# Patient Record
Sex: Female | Born: 1940 | Race: Black or African American | Hispanic: No | Smoking: Current every day smoker
Health system: Southern US, Community
[De-identification: ages and names within clinical notes are randomized; demographics above are authoritative.]

## PROBLEM LIST (undated history)

## (undated) ENCOUNTER — Emergency Department (HOSPITAL_BASED_OUTPATIENT_CLINIC_OR_DEPARTMENT_OTHER): Payer: Medicare Other | Source: Home / Self Care

## (undated) DIAGNOSIS — Z5189 Encounter for other specified aftercare: Secondary | ICD-10-CM

## (undated) DIAGNOSIS — H269 Unspecified cataract: Secondary | ICD-10-CM

## (undated) DIAGNOSIS — M1990 Unspecified osteoarthritis, unspecified site: Secondary | ICD-10-CM

## (undated) DIAGNOSIS — F419 Anxiety disorder, unspecified: Secondary | ICD-10-CM

## (undated) DIAGNOSIS — T7840XA Allergy, unspecified, initial encounter: Secondary | ICD-10-CM

## (undated) DIAGNOSIS — I10 Essential (primary) hypertension: Secondary | ICD-10-CM

## (undated) HISTORY — DX: Encounter for other specified aftercare: Z51.89

## (undated) HISTORY — DX: Unspecified cataract: H26.9

## (undated) HISTORY — PX: BACK SURGERY: SHX140

## (undated) HISTORY — DX: Allergy, unspecified, initial encounter: T78.40XA

## (undated) HISTORY — PX: ABDOMINAL HYSTERECTOMY: SHX81

---

## 2006-01-22 HISTORY — PX: COLONOSCOPY: SHX174

## 2006-10-31 ENCOUNTER — Ambulatory Visit: Payer: Self-pay | Admitting: Internal Medicine

## 2006-11-15 ENCOUNTER — Ambulatory Visit: Payer: Self-pay | Admitting: Internal Medicine

## 2008-12-21 ENCOUNTER — Encounter: Admission: RE | Admit: 2008-12-21 | Discharge: 2008-12-21 | Payer: Self-pay | Admitting: Family Medicine

## 2010-01-18 ENCOUNTER — Encounter
Admission: RE | Admit: 2010-01-18 | Discharge: 2010-01-18 | Payer: Self-pay | Source: Home / Self Care | Attending: Family Medicine | Admitting: Family Medicine

## 2011-03-30 ENCOUNTER — Encounter (HOSPITAL_BASED_OUTPATIENT_CLINIC_OR_DEPARTMENT_OTHER): Payer: Self-pay | Admitting: *Deleted

## 2011-03-30 ENCOUNTER — Emergency Department (HOSPITAL_BASED_OUTPATIENT_CLINIC_OR_DEPARTMENT_OTHER)
Admission: EM | Admit: 2011-03-30 | Discharge: 2011-03-30 | Disposition: A | Discharge status: Home / Self Care | Payer: Medicare Other | Attending: Emergency Medicine | Admitting: Emergency Medicine

## 2011-03-30 DIAGNOSIS — F411 Generalized anxiety disorder: Secondary | ICD-10-CM | POA: Insufficient documentation

## 2011-03-30 DIAGNOSIS — L0231 Cutaneous abscess of buttock: Secondary | ICD-10-CM | POA: Insufficient documentation

## 2011-03-30 DIAGNOSIS — IMO0001 Reserved for inherently not codable concepts without codable children: Secondary | ICD-10-CM | POA: Insufficient documentation

## 2011-03-30 DIAGNOSIS — I10 Essential (primary) hypertension: Secondary | ICD-10-CM | POA: Insufficient documentation

## 2011-03-30 DIAGNOSIS — M129 Arthropathy, unspecified: Secondary | ICD-10-CM | POA: Insufficient documentation

## 2011-03-30 DIAGNOSIS — Z79899 Other long term (current) drug therapy: Secondary | ICD-10-CM | POA: Insufficient documentation

## 2011-03-30 HISTORY — DX: Essential (primary) hypertension: I10

## 2011-03-30 HISTORY — DX: Unspecified osteoarthritis, unspecified site: M19.90

## 2011-03-30 HISTORY — DX: Anxiety disorder, unspecified: F41.9

## 2011-03-30 MED ORDER — OXYCODONE-ACETAMINOPHEN 5-325 MG PO TABS
1.0000 | ORAL_TABLET | Freq: Once | ORAL | Status: AC
  Administered 2011-03-30: 1 via ORAL
  Filled 2011-03-30: qty 1

## 2011-03-30 MED ORDER — OXYCODONE-ACETAMINOPHEN 5-325 MG PO TABS: 1.0000 | ORAL_TABLET | ORAL | Status: AC | PRN

## 2011-03-30 MED ORDER — SULFAMETHOXAZOLE-TRIMETHOPRIM 800-160 MG PO TABS: 1.0000 | ORAL_TABLET | Freq: Two times a day (BID) | ORAL | Status: AC

## 2011-03-30 MED ORDER — LIDOCAINE HCL 2 % IJ SOLN
10.0000 mL | Freq: Once | INTRAMUSCULAR | Status: AC
  Administered 2011-03-30: 200 mg
  Filled 2011-03-30: qty 1

## 2011-03-30 MED ORDER — SULFAMETHOXAZOLE-TMP DS 800-160 MG PO TABS
1.0000 | ORAL_TABLET | Freq: Once | ORAL | Status: AC
  Administered 2011-03-30: 1 via ORAL
  Filled 2011-03-30: qty 1

## 2011-03-30 NOTE — Discharge Instructions (Signed)
Return in 2 days to have the packing removed.  Abscess An abscess (boil or furuncle) is an infected area that contains a collection of pus.  SYMPTOMS Signs and symptoms of an abscess include pain, tenderness, redness, or hardness. You may feel a moveable soft area under your skin. An abscess can occur anywhere in the body.  TREATMENT  A surgical cut (incision) may be made over your abscess to drain the pus. Gauze may be packed into the space or a drain may be looped through the abscess cavity (pocket). This provides a drain that will allow the cavity to heal from the inside outwards. The abscess may be painful for a few days, but should feel much better if it was drained.  Your abscess, if seen early, may not have localized and may not have been drained. If not, another appointment may be required if it does not get better on its own or with medications. HOME CARE INSTRUCTIONS   Only take over-the-counter or prescription medicines for pain, discomfort, or fever as directed by your caregiver.   Take your antibiotics as directed if they were prescribed. Finish them even if you start to feel better.   Keep the skin and clothes clean around your abscess.   If the abscess was drained, you will need to use gauze dressing to collect any draining pus. Dressings will typically need to be changed 3 or more times a day.   The infection may spread by skin contact with others. Avoid skin contact as much as possible.   Practice good hygiene. This includes regular hand washing, cover any draining skin lesions, and do not share personal care items.   If you participate in sports, do not share athletic equipment, towels, whirlpools, or personal care items. Shower after every practice or tournament.   If a draining area cannot be adequately covered:   Do not participate in sports.   Children should not participate in day care until the wound has healed or drainage stops.   If your caregiver has given  you a follow-up appointment, it is very important to keep that appointment. Not keeping the appointment could result in a much worse infection, chronic or permanent injury, pain, and disability. If there is any problem keeping the appointment, you must call back to this facility for assistance.  SEEK MEDICAL CARE IF:   You develop increased pain, swelling, redness, drainage, or bleeding in the wound site.   You develop signs of generalized infection including muscle aches, chills, fever, or a general ill feeling.   You have an oral temperature above 102 F (38.9 C).  MAKE SURE YOU:   Understand these instructions.   Will watch your condition.   Will get help right away if you are not doing well or get worse.  Document Released: 10/18/2004 Document Revised: 12/28/2010 Document Reviewed: 08/12/2007 [facility] Patient Information 2012 [place], [place].  Sulfamethoxazole; Trimethoprim, SMX-TMP tablets What is this medicine? SULFAMETHOXAZOLE; TRIMETHOPRIM or SMX-TMP (suhl fuh meth OK suh zohl; trye METH oh prim) is a combination of a sulfonamide antibiotic and a second antibiotic, trimethoprim. It is used to treat or prevent certain kinds of bacterial infections. It will not work for colds, flu, or other viral infections. This medicine may be used for other purposes; ask your health care provider or pharmacist if you have questions. What should I tell my health care provider before I take this medicine? They need to know if you have any of these conditions: -anemia -asthma -being treated  with anticonvulsants -if you frequently drink alcohol containing drinks -kidney disease -liver disease -low level of folic acid or [id number]-4-[id number] dehydrogenase -poor nutrition or malabsorption -porphyria -severe allergies -thyroid disorder -an unusual or allergic reaction to sulfamethoxazole, trimethoprim, sulfa drugs, other medicines, foods, dyes, or preservatives -pregnant or trying to get  pregnant -breast-feeding How should I use this medicine? Take this medicine by mouth with a full glass of water. Follow the directions on the prescription label. Take your medicine at regular intervals. Do not take it more often than directed. Do not skip doses or stop your medicine early. Talk to your pediatrician regarding the use of this medicine in children. Special care may be needed. This medicine has been used in children as young as 58 months of age. Overdosage: If you think you have taken too much of this medicine contact a poison control center or emergency room at once. NOTE: This medicine is only for you. Do not share this medicine with others. What if I miss a dose? If you miss a dose, take it as soon as you can. If it is almost time for your next dose, take only that dose. Do not take double or extra doses. What may interact with this medicine? Do not take this medicine with any of the following medications: -aminobenzoate potassium -dofetilide -metronidazole This medicine may also interact with the following medications: -ACE inhibitors like benazepril, enalapril, lisinopril, and ramipril -cyclosporine -digoxin -diuretics -indomethacin -medicines for diabetes -methenamine -methotrexate -phenytoin -potassium supplements -pyrimethamine -sulfinpyrazone -tricyclic antidepressants -warfarin This list may not describe all possible interactions. Give your health care provider a list of all the medicines, herbs, non-prescription drugs, or dietary supplements you use. Also tell them if you smoke, drink alcohol, or use illegal drugs. Some items may interact with your medicine. What should I watch for while using this medicine? Tell your doctor or health care professional if your symptoms do not improve. Drink several glasses of water a day to reduce the risk of kidney problems. Do not treat diarrhea with over the counter products. Contact your doctor if you have diarrhea that  lasts more than 2 days or if it is severe and watery. This medicine can make you more sensitive to the sun. Keep out of the sun. If you cannot avoid being in the sun, wear protective clothing and use a sunscreen. Do not use sun lamps or tanning beds/booths. What side effects may I notice from receiving this medicine? Side effects that you should report to your doctor or health care professional as soon as possible: -allergic reactions like skin rash or hives, swelling of the face, lips, or tongue -breathing problems -fever or chills, sore throat -irregular heartbeat, chest pain -joint or muscle pain -pain or difficulty passing urine -red pinpoint spots on skin -redness, blistering, peeling or loosening of the skin, including inside the mouth -unusual bleeding or bruising -unusually weak or tired -yellowing of the eyes or skin Side effects that usually do not require medical attention (report to your doctor or health care professional if they continue or are bothersome): -diarrhea -dizziness -headache -loss of appetite -nausea, vomiting -nervousness This list may not describe all possible side effects. Call your doctor for medical advice about side effects. You may report side effects to FDA at 1-800-FDA-1088. Where should I keep my medicine? Keep out of the reach of children. Store at room temperature between 20 to 25 degrees C (68 to 77 degrees F). Protect from light. Throw away any unused medicine after the  expiration date. NOTE: This sheet is a summary. It may not cover all possible information. If you have questions about this medicine, talk to your doctor, pharmacist, or health care provider.  2012, [employer] Standard. (09/10/2007 11:32:51 AM)  Acetaminophen; Oxycodone tablets What is this medicine? ACETAMINOPHEN; OXYCODONE (a set a MEE noe fen; ox i KOE done) is a pain reliever. It is used to treat mild to moderate pain. This medicine may be used for other purposes; ask your  health care provider or pharmacist if you have questions. What should I tell my health care provider before I take this medicine? They need to know if you have any of these conditions: -brain tumor -Crohn's disease, inflammatory bowel disease, or ulcerative colitis -drink more than 3 alcohol containing drinks per day -drug abuse or addiction -head injury -heart or circulation problems -kidney disease or problems going to the bathroom -liver disease -lung disease, asthma, or breathing problems -an unusual or allergic reaction to acetaminophen, oxycodone, other opioid analgesics, other medicines, foods, dyes, or preservatives -pregnant or trying to get pregnant -breast-feeding How should I use this medicine? Take this medicine by mouth with a full glass of water. Follow the directions on the prescription label. Take your medicine at regular intervals. Do not take your medicine more often than directed. Talk to your pediatrician regarding the use of this medicine in children. Special care may be needed. Patients over 93 years old may have a stronger reaction and need a smaller dose. Overdosage: If you think you have taken too much of this medicine contact a poison control center or emergency room at once. NOTE: This medicine is only for you. Do not share this medicine with others. What if I miss a dose? If you miss a dose, take it as soon as you can. If it is almost time for your next dose, take only that dose. Do not take double or extra doses. What may interact with this medicine? -alcohol or medicines that contain alcohol -antihistamines -barbiturates like amobarbital, butalbital, butabarbital, methohexital, pentobarbital, phenobarbital, thiopental, and secobarbital -benztropine -drugs for bladder problems like solifenacin, trospium, oxybutynin, tolterodine, hyoscyamine, and methscopolamine -drugs for breathing problems like ipratropium and tiotropium -drugs for certain stomach or  intestine problems like propantheline, homatropine methylbromide, glycopyrrolate, atropine, belladonna, and dicyclomine -general anesthetics like etomidate, ketamine, nitrous oxide, propofol, desflurane, enflurane, halothane, isoflurane, and sevoflurane -medicines for depression, anxiety, or psychotic disturbances -medicines for pain like codeine, morphine, pentazocine, buprenorphine, butorphanol, nalbuphine, tramadol, and propoxyphene -medicines for sleep -muscle relaxants -naltrexone -phenothiazines like perphenazine, thioridazine, chlorpromazine, mesoridazine, fluphenazine, prochlorperazine, promazine, and trifluoperazine -scopolamine -trihexyphenidyl This list may not describe all possible interactions. Give your health care provider a list of all the medicines, herbs, non-prescription drugs, or dietary supplements you use. Also tell them if you smoke, drink alcohol, or use illegal drugs. Some items may interact with your medicine. What should I watch for while using this medicine? Tell your doctor or health care professional if your pain does not go away, if it gets worse, or if you have new or a different type of pain. You may develop tolerance to the medicine. Tolerance means that you will need a higher dose of the medication for pain relief. Tolerance is normal and is expected if you take this medicine for a long time. Do not suddenly stop taking your medicine because you may develop a severe reaction. Your body becomes used to the medicine. This does NOT mean you are addicted. Addiction is a behavior related to getting  and using a drug for a nonmedical reason. If you have pain, you have a medical reason to take pain medicine. Your doctor will tell you how much medicine to take. If your doctor wants you to stop the medicine, the dose will be slowly lowered over time to avoid any side effects. You may get drowsy or dizzy. Do not drive, use machinery, or do anything that needs mental alertness  until you know how this medicine affects you. Do not stand or sit up quickly, especially if you are an older patient. This reduces the risk of dizzy or fainting spells. Alcohol may interfere with the effect of this medicine. Avoid alcoholic drinks. The medicine will cause constipation. Try to have a bowel movement at least every 2 to 3 days. If you do not have a bowel movement for 3 days, call your doctor or health care professional. Do not take Tylenol (acetaminophen) or medicines that have acetaminophen with this medicine. Too much acetaminophen can be very dangerous. Many nonprescription medicines contain acetaminophen. Always read the labels carefully to avoid taking more acetaminophen. What side effects may I notice from receiving this medicine? Side effects that you should report to your doctor or health care professional as soon as possible: -allergic reactions like skin rash, itching or hives, swelling of the face, lips, or tongue -breathing difficulties, wheezing -confusion -light headedness or fainting spells -severe stomach pain -yellowing of the skin or the whites of the eyes Side effects that usually do not require medical attention (report to your doctor or health care professional if they continue or are bothersome): -dizziness -drowsiness -nausea -vomiting This list may not describe all possible side effects. Call your doctor for medical advice about side effects. You may report side effects to FDA at 1-800-FDA-1088. Where should I keep my medicine? Keep out of the reach of children. This medicine can be abused. Keep your medicine in a safe place to protect it from theft. Do not share this medicine with anyone. Selling or giving away this medicine is dangerous and against the law. Store at room temperature between 20 and 25 degrees C (68 and 77 degrees F). Keep container tightly closed. Protect from light. Flush any unused medicines down the toilet. Do not use the medicine after  the expiration date. NOTE: This sheet is a summary. It may not cover all possible information. If you have questions about this medicine, talk to your doctor, pharmacist, or health care provider.  2012, [employer]vier/Gold Standard. (12/08/2007 10:01:21 AM)

## 2011-03-30 NOTE — ED Notes (Signed)
Pt was asked to undress completely so that EDP could examine the affected area.

## 2011-03-30 NOTE — ED Provider Notes (Signed)
History     CSN: [id number]  Arrival date & time 03/30/11  2024   First MD Initiated Contact with Patient 03/30/11 2153      Chief Complaint  Patient presents with  . Abscess    (Consider location/radiation/quality/duration/timing/severity/associated sxs/prior treatment) Patient is a 71 y.o. female presenting with abscess. The history is provided by the patient.  Abscess   She noted the onset 3 days ago of an abscess on the left buttock near the perineal area. She's tried warm soaks but it has gotten worse. It is painful to sit on it but nothing makes it better. She rates the pain as moderate to severe and rated at 8/10 currently. She has not taken any medication for pain. She denies fever, chills, sweats. She says the last abscess she had was 40 years ago.  Past Medical History  Diagnosis Date  . Arthritis   . Hypertension   . Anxiety     Past Surgical History  Procedure Date  . Abdominal hysterectomy   . Back surgery     History reviewed. No pertinent family history.  History  Substance Use Topics  . Smoking status: Never Smoker   . Smokeless tobacco: Not on file  . Alcohol Use: No    OB History    Grav Para Term Preterm Abortions TAB SAB Ect Mult Living                  Review of Systems  All other systems reviewed and are negative.    Allergies  Review of patient's allergies indicates no known allergies.  Home Medications   Current Outpatient Rx  Name Route Sig Dispense Refill  . ACETAMINOPHEN 325 MG PO TABS Oral Take 650 mg by mouth every 6 (six) hours as needed. Patient used this medication for her arthritis pain.    Marland Kitchen CITALOPRAM HYDROBROMIDE 20 MG PO TABS Oral Take 20 mg by mouth daily.    Marland Kitchen OMEPRAZOLE 20 MG PO CPDR Oral Take 20 mg by mouth daily.    Marland Kitchen KLOR-CON M20 PO Oral Take 1 tablet by mouth daily.    . TRIAMTERENE-HCTZ 37.5-25 MG PO CAPS Oral Take 1 capsule by mouth every morning.      BP 130/60  Pulse 90  Temp(Src) 98.5 F (36.9 C)  (Oral)  Resp 18  SpO2 99%  Physical Exam  Nursing note and vitals reviewed.  71 year old female who is resting comfortably and in no acute distress. Vital signs are normal. Oxygen saturation is 99% which is normal. Head is normocephalic and atraumatic. PERRLA, EOMI. Oropharynx is clear. Neck is nontender and supple. Back is nontender. Lungs are clear without rales, wheezes, rhonchi. Heart has regular rate rhythm without murmur. Abdomen is soft, flat, nontender without masses or hepatosplenomegaly. Examination of the rectal area shows an abscess on the inferior aspect of the left buttock near the midline and near the perineum. There is no extension to the perirectal area and no extension into the labia. There is mild erythema of the overlying skin. Extremities have no cyanosis or edema, full range of motion is present. Skin is warm and dry without other rash. Neurologic: Mental status is normal, cranial nerves are intact, no focal motor or sensory deficits.  ED Course  INCISION AND DRAINAGE Date/Time: 03/30/2011 3:00 PM Performed by: Dione Booze Authorized by: Preston Fleeting, Britton Bera Consent: Verbal consent obtained. Written consent not obtained. Risks and benefits: risks, benefits and alternatives were discussed Consent given by: patient Patient understanding: patient states understanding  of the procedure being performed Patient consent: the patient's understanding of the procedure matches consent given Procedure consent: procedure consent matches procedure scheduled Relevant documents: relevant documents present and verified Site marked: the operative site was marked Required items: required blood products, implants, devices, and special equipment available Patient identity confirmed: verbally with patient and arm band Time out: Immediately prior to procedure a "time out" was called to verify the correct patient, procedure, equipment, support staff and site/side marked as required. Type: abscess Body  area: anogenital Location details: gluteal cleft Anesthesia: local infiltration Local anesthetic: lidocaine 2% without epinephrine Patient sedated: no Scalpel size: 11 Incision type: single straight Complexity: simple Drainage: purulent Drainage amount: moderate Packing material: 1/4 in iodoform gauze Patient tolerance: Patient tolerated the procedure well with no immediate complications.   (including critical care time)    1. Abscess, gluteal, left    She was given a dose of Bactrim and Percocet in the emergency department. She got good relief of pain with the Percocet. She'll be sent home with prescriptions for both of those.   MDM  Abscess of left buttock. This will need incision and drainage.        Dione Booze, MD 03/30/11 [phone]

## 2011-03-30 NOTE — ED Notes (Signed)
Abscess on her right buttocks x 4 days. No improvement with warm soaks.

## 2011-03-30 NOTE — ED Notes (Signed)
Physician at bedside to complete I&D procedure

## 2012-08-24 IMAGING — US US ABDOMEN COMPLETE
1 series · 14 of 25 positions shown · non-contrast
Comparison: None.

CLINICAL DATA: Elevated LFTs, hypertension next field none

COMPLETE ABDOMINAL ULTRASOUND

[Series 1: us abdomen complete · 0.28mm/px · 14 of 83 slices shown]
[im 1/83]
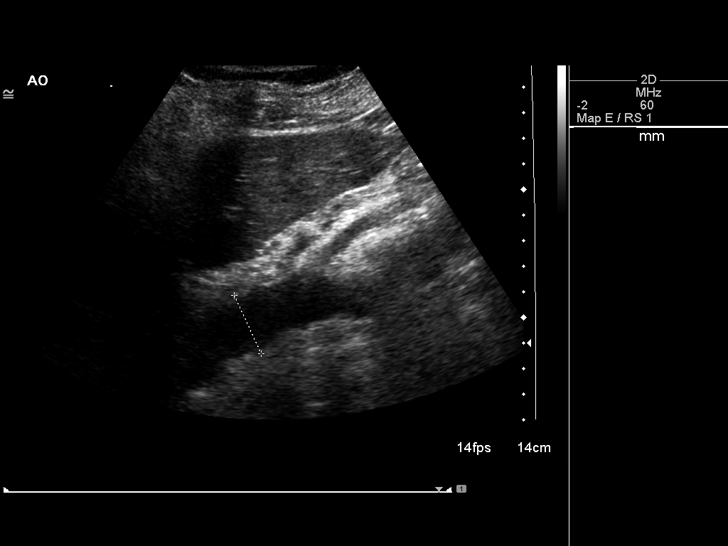
[im 7/83]
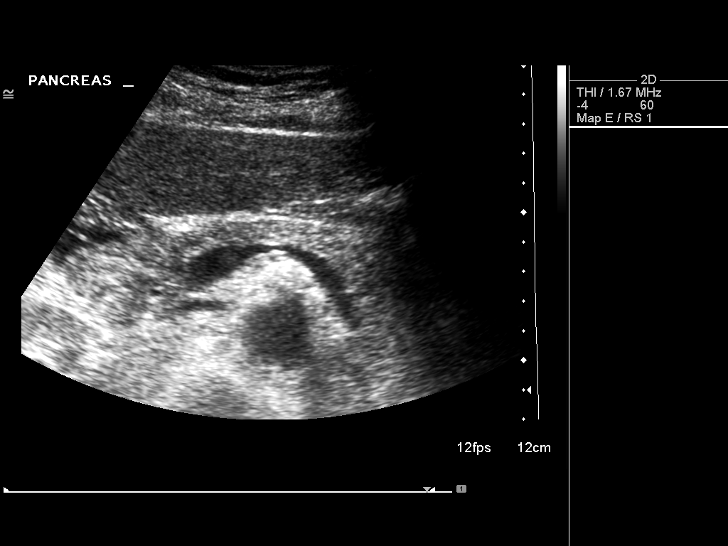
[im 14/83]
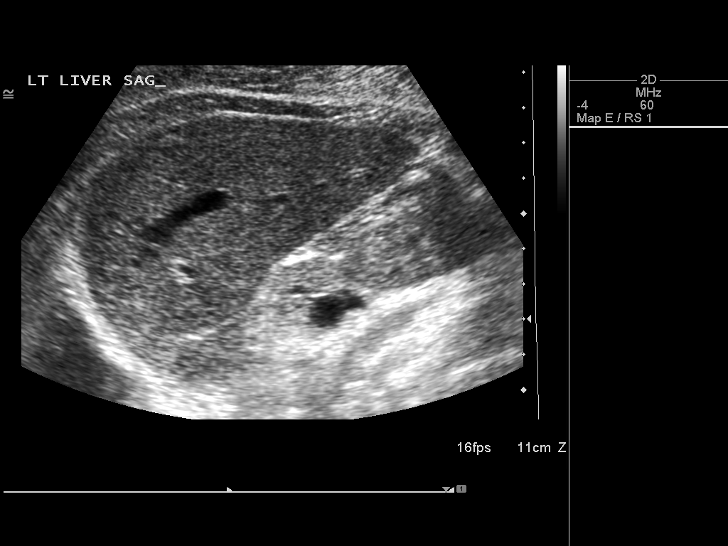
[im 21/83]
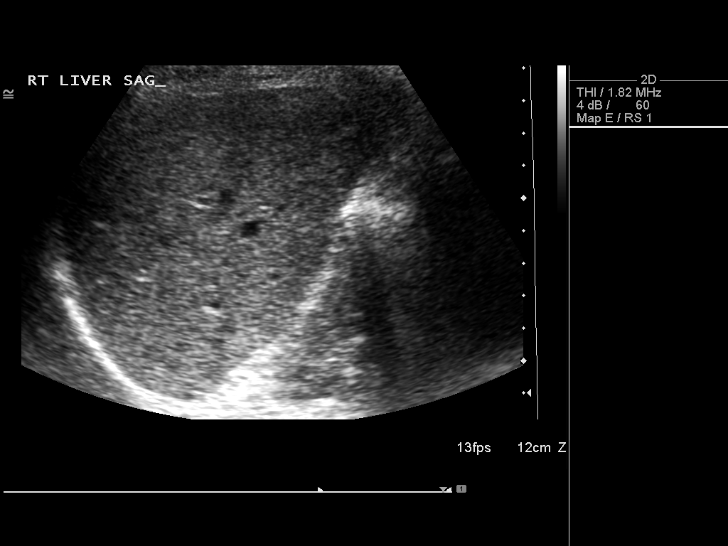
[im 28/83]
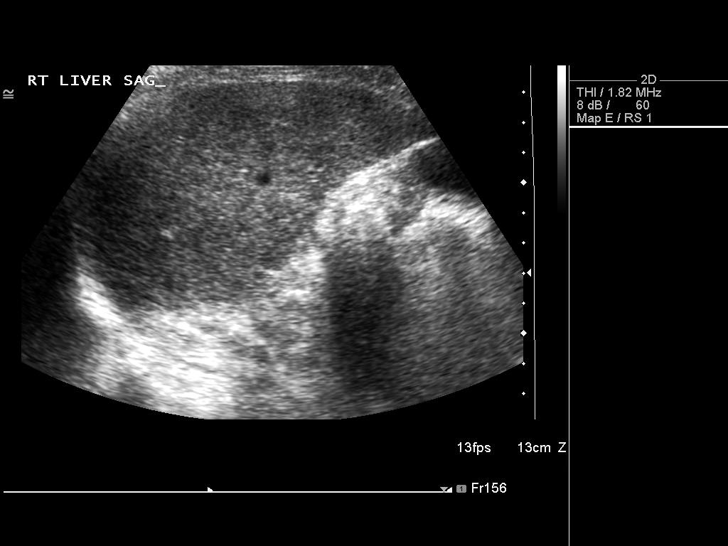
[im 31/83]
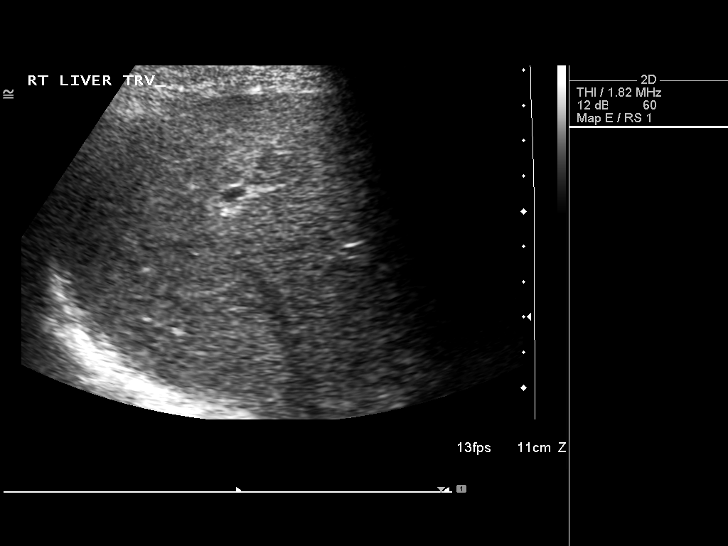
[im 38/83]
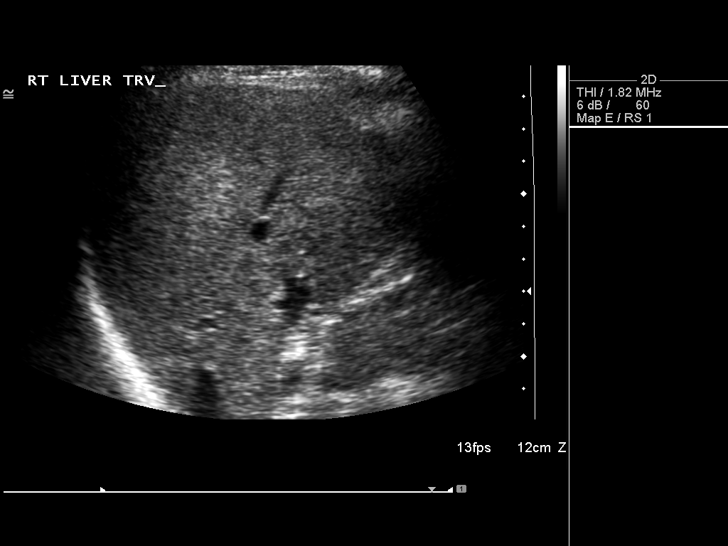
[im 45/83]
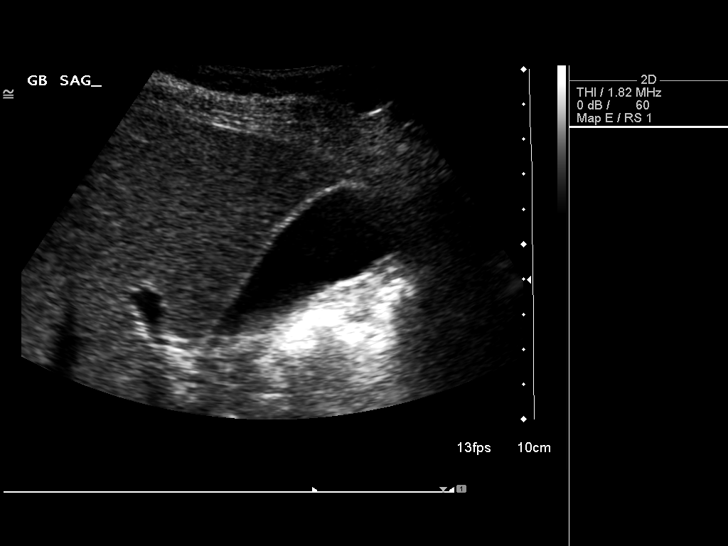
[im 52/83]
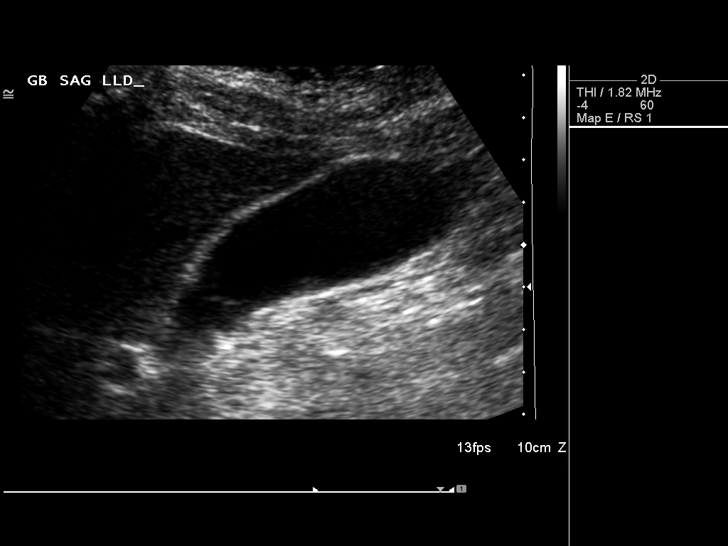
[im 55/83]
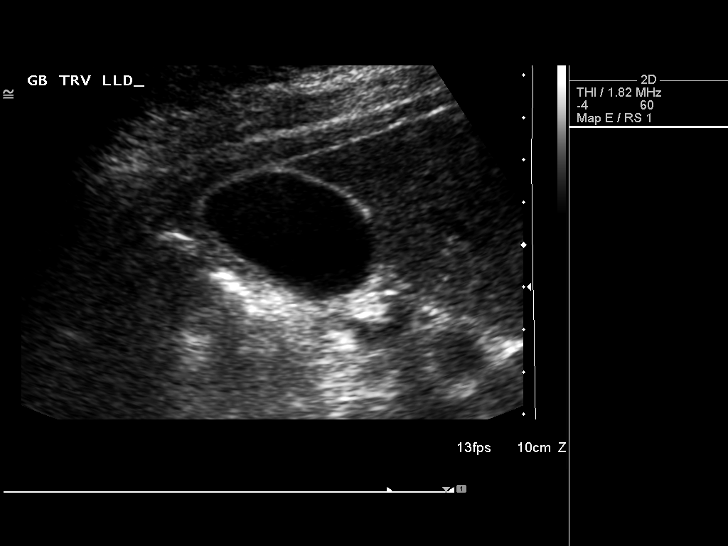
[im 62/83]
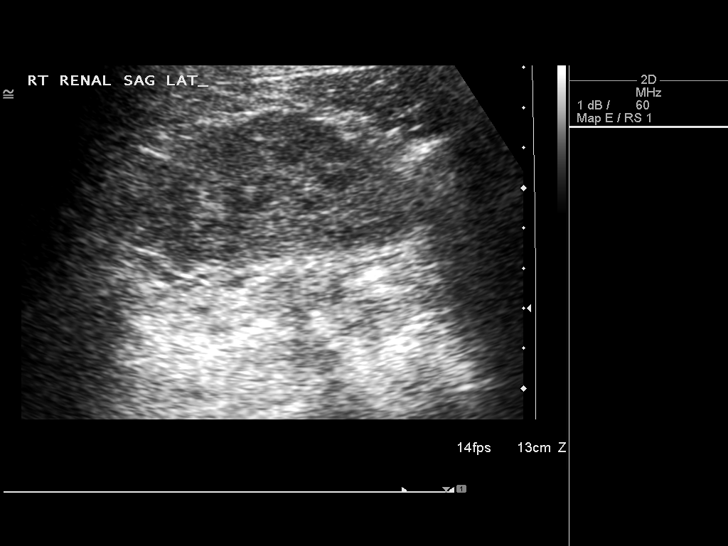
[im 69/83]
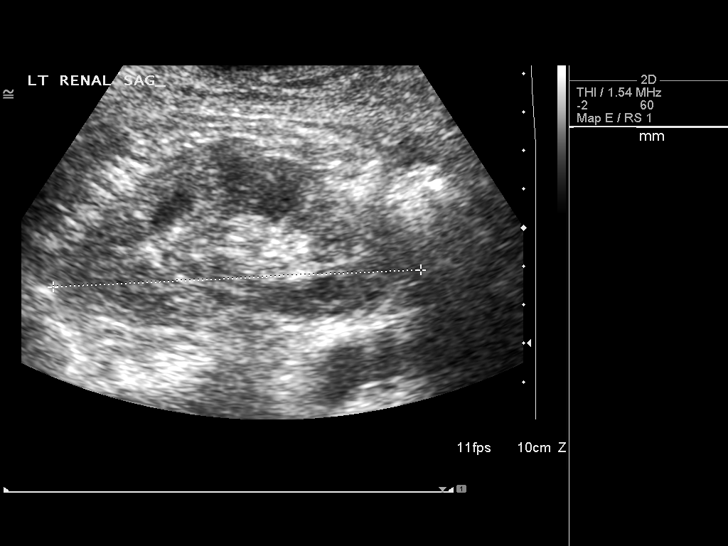
[im 76/83]
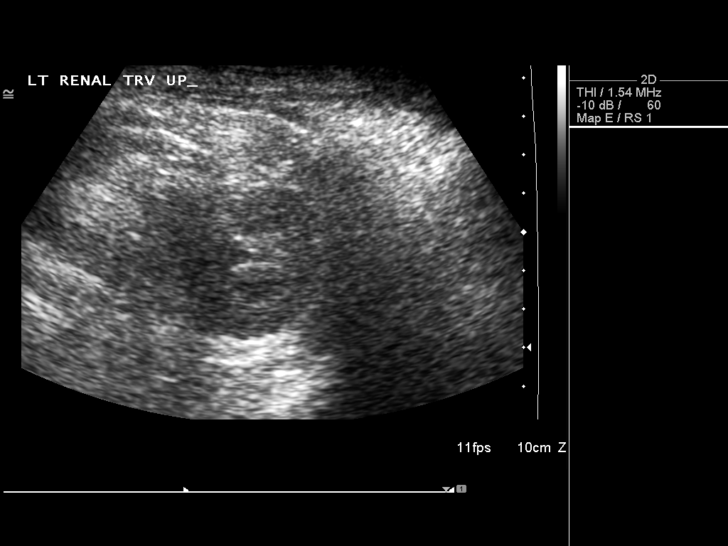
[im 83/83]
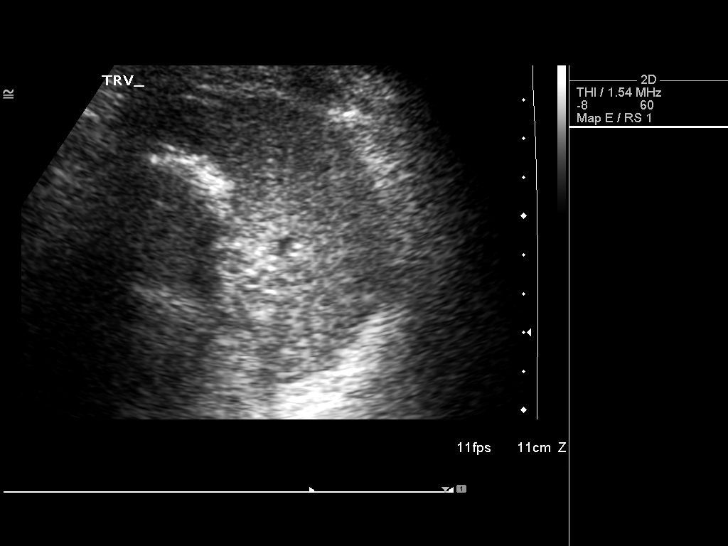

[14 of 25 positions shown; findings below may reference images not displayed]

FINDINGS: Gallbladder:  No gallstones, gallbladder wall thickening, or
pericholecystic fluid.

Common bile duct:  3.3 mm diameter, unremarkable

Liver:  No focal lesion identified.  Within normal limits in
parenchymal echogenicity.

IVC:  Appears normal.

Pancreas:  No focal abnormality seen.

Spleen:  6.3 cm craniocaudal length, unremarkable.

Right Kidney:  9.9 cm. No hydronephrosis.  Well-preserved cortex.
Normal size and parenchymal echotexture without focal
abnormalities.

Left Kidney:  9.5 cm. No hydronephrosis.  Well-preserved cortex.
Normal size and parenchymal echotexture without focal
abnormalities.

Abdominal aorta:  No aneurysm identified.
IMPRESSION: 1.  Negative.  Unremarkable liver.

## 2016-12-26 ENCOUNTER — Encounter: Payer: Self-pay | Admitting: Internal Medicine

## 2017-06-07 ENCOUNTER — Encounter: Payer: Self-pay | Admitting: Internal Medicine

## 2017-07-13 ENCOUNTER — Other Ambulatory Visit: Payer: Self-pay

## 2017-07-13 ENCOUNTER — Emergency Department (HOSPITAL_BASED_OUTPATIENT_CLINIC_OR_DEPARTMENT_OTHER)
Admission: EM | Admit: 2017-07-13 | Discharge: 2017-07-13 | Disposition: A | Discharge status: Home / Self Care | Payer: Medicare Other | Attending: Emergency Medicine | Admitting: Emergency Medicine

## 2017-07-13 ENCOUNTER — Encounter (HOSPITAL_BASED_OUTPATIENT_CLINIC_OR_DEPARTMENT_OTHER): Payer: Self-pay | Admitting: Emergency Medicine

## 2017-07-13 DIAGNOSIS — K029 Dental caries, unspecified: Secondary | ICD-10-CM | POA: Diagnosis not present

## 2017-07-13 DIAGNOSIS — K047 Periapical abscess without sinus: Secondary | ICD-10-CM

## 2017-07-13 DIAGNOSIS — K1379 Other lesions of oral mucosa: Secondary | ICD-10-CM | POA: Diagnosis not present

## 2017-07-13 DIAGNOSIS — F419 Anxiety disorder, unspecified: Secondary | ICD-10-CM | POA: Insufficient documentation

## 2017-07-13 DIAGNOSIS — I10 Essential (primary) hypertension: Secondary | ICD-10-CM | POA: Insufficient documentation

## 2017-07-13 DIAGNOSIS — Z79899 Other long term (current) drug therapy: Secondary | ICD-10-CM | POA: Insufficient documentation

## 2017-07-13 DIAGNOSIS — K120 Recurrent oral aphthae: Secondary | ICD-10-CM

## 2017-07-13 DIAGNOSIS — R6884 Jaw pain: Secondary | ICD-10-CM | POA: Diagnosis present

## 2017-07-13 MED ORDER — AMOXICILLIN-POT CLAVULANATE 875-125 MG PO TABS
1.0000 | ORAL_TABLET | Freq: Once | ORAL | Status: AC
  Administered 2017-07-13: 1 via ORAL
  Filled 2017-07-13: qty 1

## 2017-07-13 MED ORDER — HYDROCODONE-ACETAMINOPHEN 5-325 MG PO TABS: 1.0000 | ORAL_TABLET | Freq: Four times a day (QID) | ORAL | 0 refills | Status: AC | PRN

## 2017-07-13 MED ORDER — AMOXICILLIN-POT CLAVULANATE 875-125 MG PO TABS: 1.0000 | ORAL_TABLET | Freq: Two times a day (BID) | ORAL | 0 refills | Status: DC

## 2017-07-13 MED ORDER — HYDROCODONE-ACETAMINOPHEN 5-325 MG PO TABS
1.0000 | ORAL_TABLET | Freq: Once | ORAL | Status: AC
  Administered 2017-07-13: 1 via ORAL
  Filled 2017-07-13: qty 1

## 2017-07-13 NOTE — ED Triage Notes (Signed)
Patient states that she has pain to the left lower jaw. The patient reports that she may have an abscess as well.

## 2017-07-13 NOTE — ED Provider Notes (Signed)
[facility] EMERGENCY DEPARTMENT Provider Note   CSN: [id number] Arrival date & time: 07/13/17  1805     History   Chief Complaint Chief Complaint  Patient presents with  . Dental Pain    HPI Kathy Rivera is a 77 y.o. female hx of HTN, anxiety, poor dentition here with L lower jaw pain. States that she had L lower jaw and dental pain for the last 2-3 days. She noticed swelling of her gums. Denies trouble swallowing or fever or trouble breathing. She states that she had multiple crowns placed years ago and was told that she needed more dental work done. Denies hx of diabetes.   The history is provided by the patient.    Past Medical History:  Diagnosis Date  . Anxiety   . Arthritis   . Hypertension     There are no active problems to display for this patient.   Past Surgical History:  Procedure Laterality Date  . ABDOMINAL HYSTERECTOMY    . BACK SURGERY       OB History   None      Home Medications    Prior to Admission medications   Medication Sig Start Date End Date Taking? Authorizing Provider  acetaminophen (TYLENOL) 325 MG tablet Take 650 mg by mouth every 6 (six) hours as needed. Patient used this medication for her arthritis pain.    [provider]  citalopram (CELEXA) 20 MG tablet Take 20 mg by mouth daily.    [provider]  omeprazole (PRILOSEC) 20 MG capsule Take 20 mg by mouth daily.    [provider]  Potassium Chloride Crys CR (KLOR-CON M20 PO) Take 1 tablet by mouth daily.    [provider]  triamterene-hydrochlorothiazide (DYAZIDE) 37.5-25 MG per capsule Take 1 capsule by mouth every morning.    [provider]    Family History History reviewed. No pertinent family history.  Social History Social History   Tobacco Use  . Smoking status: Never Smoker  Substance Use Topics  . Alcohol use: No  . Drug use: No     Allergies   Codeine   Review of Systems Review of Systems    HENT: Positive for dental problem.   All other systems reviewed and are negative.    Physical Exam Updated Vital Signs BP (!) 141/85 (BP Location: Left Arm)   Pulse 75   Temp 98.6 F (37 C) (Oral)   Resp 16   Ht 5\' 6"  (1.676 m)   Wt 78 kg (172 lb)   SpO2 98%   BMI 27.76 kg/m   Physical Exam  Constitutional: She appears well-developed.  HENT:  Head: Normocephalic.  Mouth/Throat:    Filling L lower premolar and canine. Some gum swelling but no obvious abscess. There is aphthous ulcer next to L lower premolar. No cervical LAD, floor of mouth soft, uvula midline, tonsils not enlarged   Eyes: Pupils are equal, round, and reactive to light. EOM are normal.  Neck: Normal range of motion. Neck supple.  Cardiovascular: Normal rate and regular rhythm.  Pulmonary/Chest: Effort normal and breath sounds normal.  Abdominal: Soft. Bowel sounds are normal. She exhibits no distension.  Musculoskeletal: Normal range of motion.  Neurological: She is alert.  Skin: Skin is warm.  Psychiatric: She has a normal mood and affect.  Nursing note and vitals reviewed.    ED Treatments / Results  Labs (all labs ordered are listed, but only abnormal results are displayed) Labs Reviewed -  No data to display  EKG None  Radiology No results found.  Procedures Procedures (including critical care time)  Medications Ordered in ED Medications  amoxicillin-clavulanate (AUGMENTIN) 875-125 MG per tablet 1 tablet (has no administration in time range)  HYDROcodone-acetaminophen (NORCO/VICODIN) 5-325 MG per tablet 1 tablet (has no administration in time range)     Initial Impression / Assessment and Plan / ED Course  I have reviewed the triage vital signs and the nursing notes.  Pertinent labs & imaging results that were available during my care of the patient were reviewed by me and considered in my medical decision making (see chart for details).     Chauncey FischerCarrie L Stryker is a 77 y.o. female here  with l lower gum swelling and pain. There is some filling, ? Cavities there still. Likely gum infection vs early periapical abscess. However, there doesn't seem to be large enough for I and D. She also has aphthous ulcer that is likely causing her pain. Will give augmentin, vicodin for pain. She has dental appointment on Monday.    Final Clinical Impressions(s) / ED Diagnoses   Final diagnoses:  None    ED Discharge Orders    None       Charlynne PanderYao, Clavin Ruhlman Hsienta, MD 07/13/17 Nicholos [id number]

## 2017-07-13 NOTE — ED Notes (Signed)
Pt teaching provided on medications that may cause drowsiness. Pt instructed not to drive or operate heavy machinery while taking the prescribed medication. Pt verbalized understanding.   

## 2017-07-13 NOTE — Discharge Instructions (Signed)
Take motrin for pain.   Take vicodin for severe pain. Do NOT drive with it   Take augmentin twice daily for a week.   See your dentist as scheduled on Monday   Return to ER if you have worse neck or gum swelling, severe dental pain, fever, trouble swallowing, trouble breathing

## 2017-07-17 ENCOUNTER — Telehealth: Payer: Self-pay

## 2017-07-17 NOTE — Telephone Encounter (Signed)
Patient No Showed for Pre-Visit today. A message was left on voice mail to call and reschedule before 5:00 Pm today. If patient does not reschedule today her colonoscopy will be cancelled per [facility] guidelines and a No Show letter will be mailed.   Janalee DaneNancy Lavaris Sexson, LPN ( [facility] )

## 2017-07-30 ENCOUNTER — Encounter: Payer: Self-pay | Admitting: Internal Medicine

## 2017-07-31 ENCOUNTER — Encounter: Payer: Medicare Other | Admitting: Internal Medicine

## 2017-09-18 ENCOUNTER — Ambulatory Visit (AMBULATORY_SURGERY_CENTER): Payer: Self-pay | Admitting: *Deleted

## 2017-09-18 VITALS — Ht 64.5 in | Wt 164.0 lb

## 2017-09-18 DIAGNOSIS — Z1211 Encounter for screening for malignant neoplasm of colon: Secondary | ICD-10-CM

## 2017-09-18 NOTE — Progress Notes (Signed)
No egg or soy allergy known to patient  No issues with past sedation with any surgeries  or procedures, no intubation problems  No diet pills per patient No home 02 use per patient  No blood thinners per patient  Pt denies issues with constipation  No A fib or A flutter  EMMI video sent to pt's e mail  

## 2017-09-19 ENCOUNTER — Encounter: Payer: Self-pay | Admitting: Internal Medicine

## 2017-10-02 ENCOUNTER — Ambulatory Visit (AMBULATORY_SURGERY_CENTER): Payer: Medicare Other | Admitting: Internal Medicine

## 2017-10-02 ENCOUNTER — Encounter: Payer: Self-pay | Admitting: Internal Medicine

## 2017-10-02 VITALS — BP 119/49 | HR 63 | Temp 98.9°F | Resp 22 | Ht 64.5 in | Wt 164.0 lb

## 2017-10-02 DIAGNOSIS — Z1211 Encounter for screening for malignant neoplasm of colon: Secondary | ICD-10-CM

## 2017-10-02 MED ORDER — SODIUM CHLORIDE 0.9 % IV SOLN: 500.0000 mL | Freq: Once | INTRAVENOUS | Status: DC

## 2017-10-02 NOTE — Op Note (Signed)
[facility] Patient Name: Kathy Rivera Procedure Date: 10/02/2017 7:39 AM MRN: [id number] Endoscopist: Iva Boop , MD Age: 77 Referring MD:  Date of Birth: [birth year] Gender: Female Account #: [id number] Procedure:                Colonoscopy Indications:              Screening for colorectal malignant neoplasm, Last                            colonoscopy: 2008 Medicines:                Propofol per Anesthesia, Monitored Anesthesia Care Procedure:                Pre-Anesthesia Assessment:                           - Prior to the procedure, a History and Physical                            was performed, and patient medications and                            allergies were reviewed. The patient's tolerance of                            previous anesthesia was also reviewed. The risks                            and benefits of the procedure and the sedation                            options and risks were discussed with the patient.                            All questions were answered, and informed consent                            was obtained. Prior Anticoagulants: The patient has                            taken no previous anticoagulant or antiplatelet                            agents. ASA Grade Assessment: II - A patient with                            mild systemic disease. After reviewing the risks                            and benefits, the patient was deemed in                            satisfactory condition to undergo the procedure.  After obtaining informed consent, the colonoscope                            was passed under direct vision. Throughout the                            procedure, the patient's blood pressure, pulse, and                            oxygen saturations were monitored continuously. The                            Model PCF-H190DL [id number]) scope was introduced                            through the anus  and advanced to the the cecum,                            identified by appendiceal orifice and ileocecal                            valve. The colonoscopy was somewhat difficult due                            to significant looping. Successful completion of                            the procedure was aided by applying abdominal                            pressure. The patient tolerated the procedure well.                            The quality of the bowel preparation was good. The                            bowel preparation used was Miralax. The ileocecal                            valve, appendiceal orifice, and rectum were                            photographed. Scope In: 7:46:24 AM Scope Out: 8:07:00 AM Scope Withdrawal Time: 0 hours 12 minutes 7 seconds  Total Procedure Duration: 0 hours 20 minutes 36 seconds  Findings:                 The perianal and digital rectal examinations were                            normal.                           Multiple small and large-mouthed diverticula were  found in the entire colon.                           The exam was otherwise without abnormality on                            direct and retroflexion views. Complications:            No immediate complications. Estimated Blood Loss:     Estimated blood loss: none. Impression:               - Diverticulosis in the entire examined colon.                           - The examination was otherwise normal on direct                            and retroflexion views.                           - No specimens collected. Recommendation:           - Patient has a contact number available for                            emergencies. The signs and symptoms of potential                            delayed complications were discussed with the                            patient. Return to normal activities tomorrow.                            Written discharge instructions were  provided to the                            patient.                           - Resume previous diet.                           - Continue present medications.                           - No repeat colonoscopy due to current age (31                            years or older) and the absence of colonic polyps. Iva Boop, MD 10/02/2017 8:13:41 AM This report has been signed electronically.

## 2017-10-02 NOTE — Progress Notes (Signed)
Report to PACU, RN, vss, BBS= Clear.  

## 2017-10-02 NOTE — Progress Notes (Signed)
Pt's states no medical or surgical changes since previsit or office visit. 

## 2017-10-02 NOTE — Patient Instructions (Addendum)
No polyps No cancer seen  You do have diverticulosis - thickened muscle rings and pouches in the colon wall. Please read the handout about this condition.  You do not need any more routine colon cancer screening tests.  I appreciate the opportunity to care for you. Iva Boop, MD, [facility]   Discharge instructions given. Handout on diverticulosis. Resume previous medications. YOU HAD AN ENDOSCOPIC PROCEDURE TODAY AT THE [facility]:   Refer to the procedure report that was given to you for any specific questions about what was found during the examination.  If the procedure report does not answer your questions, please call your gastroenterologist to clarify.  If you requested that your care partner not be given the details of your procedure findings, then the procedure report has been included in a sealed envelope for you to review at your convenience later.  YOU SHOULD EXPECT: Some feelings of bloating in the abdomen. Passage of more gas than usual.  Walking can help get rid of the air that was put into your GI tract during the procedure and reduce the bloating. If you had a lower endoscopy (such as a colonoscopy or flexible sigmoidoscopy) you may notice spotting of blood in your stool or on the toilet paper. If you underwent a bowel prep for your procedure, you may not have a normal bowel movement for a few days.  Please Note:  You might notice some irritation and congestion in your nose or some drainage.  This is from the oxygen used during your procedure.  There is no need for concern and it should clear up in a day or so.  SYMPTOMS TO REPORT IMMEDIATELY:   Following lower endoscopy (colonoscopy or flexible sigmoidoscopy):  Excessive amounts of blood in the stool  Significant tenderness or worsening of abdominal pains  Swelling of the abdomen that is new, acute  Fever of 100F or higher   For urgent or emergent issues, a gastroenterologist can be reached at  any hour by calling [phone].   DIET:  We do recommend a small meal at first, but then you may proceed to your regular diet.  Drink plenty of fluids but you should avoid alcoholic beverages for 24 hours.  ACTIVITY:  You should plan to take it easy for the rest of today and you should NOT DRIVE or use heavy machinery until tomorrow (because of the sedation medicines used during the test).    FOLLOW UP: Our staff will call the number listed on your records the next business day following your procedure to check on you and address any questions or concerns that you may have regarding the information given to you following your procedure. If we do not reach you, we will leave a message.  However, if you are feeling well and you are not experiencing any problems, there is no need to return our call.  We will assume that you have returned to your regular daily activities without incident.  If any biopsies were taken you will be contacted by phone or by letter within the next 1-3 weeks.  Please call us at [phone] if you have not heard about the biopsies in 3 weeks.    SIGNATURES/CONFIDENTIALITY: You and/or your care partner have signed paperwork which will be entered into your electronic medical record.  These signatures attest to the fact that that the information above on your After Visit Summary has been reviewed and is understood.  Full responsibility of the confidentiality of  this discharge information lies with you and/or your care-partner.

## 2017-10-03 ENCOUNTER — Telehealth: Payer: Self-pay

## 2017-10-03 NOTE — Telephone Encounter (Signed)
  Follow up Call-  Call back number 10/02/2017  Post procedure Call Back phone  # [phone]  Permission to leave phone message Yes  Some recent data might be hidden     Patient questions:  Do you have a fever, pain , or abdominal swelling? No. Pain Score  0 *  Have you tolerated food without any problems? Yes.    Have you been able to return to your normal activities? Yes.    Do you have any questions about your discharge instructions: Diet   No. Medications  No. Follow up visit  No.  Do you have questions or concerns about your Care? No.  Actions: * If pain score is 4 or above: No action needed, pain <4.

## 2019-02-19 DIAGNOSIS — F320 Major depressive disorder, single episode, mild: Secondary | ICD-10-CM | POA: Diagnosis not present

## 2019-02-19 DIAGNOSIS — M810 Age-related osteoporosis without current pathological fracture: Secondary | ICD-10-CM | POA: Diagnosis not present

## 2019-02-19 DIAGNOSIS — I10 Essential (primary) hypertension: Secondary | ICD-10-CM | POA: Diagnosis not present

## 2019-04-01 DIAGNOSIS — I10 Essential (primary) hypertension: Secondary | ICD-10-CM | POA: Diagnosis not present

## 2019-04-01 DIAGNOSIS — Z0000 Encounter for general adult medical examination without abnormal findings: Secondary | ICD-10-CM | POA: Diagnosis not present

## 2019-04-01 DIAGNOSIS — M810 Age-related osteoporosis without current pathological fracture: Secondary | ICD-10-CM | POA: Diagnosis not present

## 2019-04-01 DIAGNOSIS — F418 Other specified anxiety disorders: Secondary | ICD-10-CM | POA: Diagnosis not present

## 2019-04-02 DIAGNOSIS — I10 Essential (primary) hypertension: Secondary | ICD-10-CM | POA: Diagnosis not present

## 2019-05-22 DIAGNOSIS — F320 Major depressive disorder, single episode, mild: Secondary | ICD-10-CM | POA: Diagnosis not present

## 2019-05-22 DIAGNOSIS — M810 Age-related osteoporosis without current pathological fracture: Secondary | ICD-10-CM | POA: Diagnosis not present

## 2019-05-22 DIAGNOSIS — I10 Essential (primary) hypertension: Secondary | ICD-10-CM | POA: Diagnosis not present

## 2019-06-11 DIAGNOSIS — F320 Major depressive disorder, single episode, mild: Secondary | ICD-10-CM | POA: Diagnosis not present

## 2019-06-11 DIAGNOSIS — M810 Age-related osteoporosis without current pathological fracture: Secondary | ICD-10-CM | POA: Diagnosis not present

## 2019-06-11 DIAGNOSIS — I10 Essential (primary) hypertension: Secondary | ICD-10-CM | POA: Diagnosis not present

## 2019-06-24 DIAGNOSIS — M810 Age-related osteoporosis without current pathological fracture: Secondary | ICD-10-CM | POA: Diagnosis not present

## 2019-06-24 DIAGNOSIS — Z780 Asymptomatic menopausal state: Secondary | ICD-10-CM | POA: Diagnosis not present

## 2019-06-24 DIAGNOSIS — Z1231 Encounter for screening mammogram for malignant neoplasm of breast: Secondary | ICD-10-CM | POA: Diagnosis not present

## 2019-09-18 DIAGNOSIS — M810 Age-related osteoporosis without current pathological fracture: Secondary | ICD-10-CM | POA: Diagnosis not present

## 2019-09-18 DIAGNOSIS — K219 Gastro-esophageal reflux disease without esophagitis: Secondary | ICD-10-CM | POA: Diagnosis not present

## 2019-09-18 DIAGNOSIS — F320 Major depressive disorder, single episode, mild: Secondary | ICD-10-CM | POA: Diagnosis not present

## 2019-09-18 DIAGNOSIS — I10 Essential (primary) hypertension: Secondary | ICD-10-CM | POA: Diagnosis not present

## 2020-01-09 DIAGNOSIS — S0990XA Unspecified injury of head, initial encounter: Secondary | ICD-10-CM | POA: Diagnosis not present

## 2020-01-09 DIAGNOSIS — I10 Essential (primary) hypertension: Secondary | ICD-10-CM | POA: Diagnosis not present

## 2020-01-09 DIAGNOSIS — R42 Dizziness and giddiness: Secondary | ICD-10-CM | POA: Diagnosis not present

## 2020-01-09 DIAGNOSIS — G9389 Other specified disorders of brain: Secondary | ICD-10-CM | POA: Diagnosis not present

## 2020-01-09 DIAGNOSIS — Y998 Other external cause status: Secondary | ICD-10-CM | POA: Diagnosis not present

## 2020-01-09 DIAGNOSIS — W228XXA Striking against or struck by other objects, initial encounter: Secondary | ICD-10-CM | POA: Diagnosis not present

## 2020-01-09 DIAGNOSIS — R110 Nausea: Secondary | ICD-10-CM | POA: Diagnosis not present

## 2020-01-09 DIAGNOSIS — I491 Atrial premature depolarization: Secondary | ICD-10-CM | POA: Diagnosis not present

## 2020-01-09 DIAGNOSIS — I6782 Cerebral ischemia: Secondary | ICD-10-CM | POA: Diagnosis not present

## 2020-01-09 DIAGNOSIS — W19XXXA Unspecified fall, initial encounter: Secondary | ICD-10-CM | POA: Diagnosis not present

## 2020-01-19 DIAGNOSIS — F320 Major depressive disorder, single episode, mild: Secondary | ICD-10-CM | POA: Diagnosis not present

## 2020-01-19 DIAGNOSIS — F419 Anxiety disorder, unspecified: Secondary | ICD-10-CM | POA: Diagnosis not present

## 2020-01-19 DIAGNOSIS — L0232 Furuncle of buttock: Secondary | ICD-10-CM | POA: Diagnosis not present

## 2020-01-19 DIAGNOSIS — R42 Dizziness and giddiness: Secondary | ICD-10-CM | POA: Diagnosis not present

## 2020-01-19 DIAGNOSIS — I10 Essential (primary) hypertension: Secondary | ICD-10-CM | POA: Diagnosis not present

## 2020-07-15 DIAGNOSIS — E876 Hypokalemia: Secondary | ICD-10-CM | POA: Diagnosis not present

## 2020-07-15 DIAGNOSIS — F17210 Nicotine dependence, cigarettes, uncomplicated: Secondary | ICD-10-CM | POA: Diagnosis not present

## 2020-07-15 DIAGNOSIS — F419 Anxiety disorder, unspecified: Secondary | ICD-10-CM | POA: Diagnosis not present

## 2020-07-15 DIAGNOSIS — Z0000 Encounter for general adult medical examination without abnormal findings: Secondary | ICD-10-CM | POA: Diagnosis not present

## 2020-07-15 DIAGNOSIS — M810 Age-related osteoporosis without current pathological fracture: Secondary | ICD-10-CM | POA: Diagnosis not present

## 2020-07-15 DIAGNOSIS — F320 Major depressive disorder, single episode, mild: Secondary | ICD-10-CM | POA: Diagnosis not present

## 2020-07-15 DIAGNOSIS — I10 Essential (primary) hypertension: Secondary | ICD-10-CM | POA: Diagnosis not present

## 2020-07-26 DIAGNOSIS — E876 Hypokalemia: Secondary | ICD-10-CM | POA: Diagnosis not present

## 2020-07-26 DIAGNOSIS — M810 Age-related osteoporosis without current pathological fracture: Secondary | ICD-10-CM | POA: Diagnosis not present

## 2020-07-26 DIAGNOSIS — F419 Anxiety disorder, unspecified: Secondary | ICD-10-CM | POA: Diagnosis not present

## 2020-07-26 DIAGNOSIS — F320 Major depressive disorder, single episode, mild: Secondary | ICD-10-CM | POA: Diagnosis not present

## 2020-07-26 DIAGNOSIS — I10 Essential (primary) hypertension: Secondary | ICD-10-CM | POA: Diagnosis not present

## 2021-01-16 DIAGNOSIS — R21 Rash and other nonspecific skin eruption: Secondary | ICD-10-CM | POA: Diagnosis not present

## 2021-01-16 DIAGNOSIS — R030 Elevated blood-pressure reading, without diagnosis of hypertension: Secondary | ICD-10-CM | POA: Diagnosis not present

## 2021-02-22 DIAGNOSIS — L299 Pruritus, unspecified: Secondary | ICD-10-CM | POA: Diagnosis not present

## 2021-02-22 DIAGNOSIS — R21 Rash and other nonspecific skin eruption: Secondary | ICD-10-CM | POA: Diagnosis not present

## 2021-02-22 DIAGNOSIS — I10 Essential (primary) hypertension: Secondary | ICD-10-CM | POA: Diagnosis not present

## 2021-02-22 DIAGNOSIS — F32A Depression, unspecified: Secondary | ICD-10-CM | POA: Diagnosis not present

## 2021-03-13 DIAGNOSIS — I10 Essential (primary) hypertension: Secondary | ICD-10-CM | POA: Diagnosis not present

## 2021-03-13 DIAGNOSIS — F411 Generalized anxiety disorder: Secondary | ICD-10-CM | POA: Diagnosis not present

## 2021-03-13 DIAGNOSIS — M810 Age-related osteoporosis without current pathological fracture: Secondary | ICD-10-CM | POA: Diagnosis not present
